# Patient Record
Sex: Male | Born: 1945 | Race: White | Hispanic: No
Health system: Southern US, Community
[De-identification: ages and names within clinical notes are randomized; demographics above are authoritative.]

## PROBLEM LIST (undated history)

## (undated) DIAGNOSIS — I10 Essential (primary) hypertension: Secondary | ICD-10-CM

## (undated) DIAGNOSIS — H3530 Unspecified macular degeneration: Secondary | ICD-10-CM

## (undated) HISTORY — DX: Unspecified macular degeneration: H35.30

## (undated) HISTORY — PX: KNEE ARTHROPLASTY: SHX992

## (undated) HISTORY — DX: Essential (primary) hypertension: I10

## (undated) HISTORY — PX: CATARACT EXTRACTION, BILATERAL: SHX1313

## (undated) HISTORY — PX: HERNIA REPAIR: SHX51

## (undated) HISTORY — PX: FOOT SURGERY: SHX648

---

## 1999-02-26 ENCOUNTER — Ambulatory Visit (HOSPITAL_BASED_OUTPATIENT_CLINIC_OR_DEPARTMENT_OTHER): Admission: RE | Admit: 1999-02-26 | Discharge: 1999-02-26 | Payer: Self-pay | Admitting: Orthopedic Surgery

## 2000-05-19 ENCOUNTER — Encounter: Admission: RE | Admit: 2000-05-19 | Discharge: 2000-05-19 | Payer: Self-pay | Admitting: General Surgery

## 2000-05-19 ENCOUNTER — Encounter: Payer: Self-pay | Admitting: General Surgery

## 2000-05-23 ENCOUNTER — Ambulatory Visit (HOSPITAL_BASED_OUTPATIENT_CLINIC_OR_DEPARTMENT_OTHER): Admission: RE | Admit: 2000-05-23 | Discharge: 2000-05-23 | Payer: Self-pay | Admitting: General Surgery

## 2002-06-17 ENCOUNTER — Ambulatory Visit (HOSPITAL_COMMUNITY): Admission: RE | Admit: 2002-06-17 | Discharge: 2002-06-17 | Payer: Self-pay | Admitting: *Deleted

## 2003-02-04 ENCOUNTER — Ambulatory Visit (HOSPITAL_COMMUNITY): Admission: RE | Admit: 2003-02-04 | Discharge: 2003-02-04 | Payer: Self-pay | Admitting: General Surgery

## 2003-02-04 ENCOUNTER — Encounter: Payer: Self-pay | Admitting: General Surgery

## 2005-03-18 ENCOUNTER — Ambulatory Visit (HOSPITAL_COMMUNITY): Admission: RE | Admit: 2005-03-18 | Discharge: 2005-03-18 | Payer: Self-pay | Admitting: Orthopedic Surgery

## 2005-03-18 ENCOUNTER — Ambulatory Visit (HOSPITAL_BASED_OUTPATIENT_CLINIC_OR_DEPARTMENT_OTHER): Admission: RE | Admit: 2005-03-18 | Discharge: 2005-03-18 | Payer: Self-pay | Admitting: Orthopedic Surgery

## 2009-08-29 ENCOUNTER — Encounter: Admission: RE | Admit: 2009-08-29 | Discharge: 2009-10-03 | Payer: Self-pay | Admitting: Orthopedic Surgery

## 2010-07-09 ENCOUNTER — Encounter
Admission: RE | Admit: 2010-07-09 | Discharge: 2010-07-27 | Payer: Self-pay | Source: Home / Self Care | Attending: Orthopedic Surgery | Admitting: Orthopedic Surgery

## 2010-07-29 ENCOUNTER — Encounter
Admission: RE | Admit: 2010-07-29 | Discharge: 2010-08-14 | Payer: Self-pay | Source: Home / Self Care | Attending: Orthopedic Surgery | Admitting: Orthopedic Surgery

## 2010-12-14 NOTE — Op Note (Signed)
NAMEOTT, Eric Skinner                ACCOUNT NO.:  [id number]   MEDICAL RECORD NO.:  [id number]          PATIENT TYPE:  AMB   LOCATION:  DSC                          FACILITY:  [facility]   PHYSICIAN:  Feliberto Gottron. Turner Daniels, M.D.   DATE OF BIRTH:  06-13-1946   DATE OF PROCEDURE:  03/18/2005  DATE OF DISCHARGE:                                 OPERATIVE REPORT   PREOPERATIVE DIAGNOSIS:  Left knee medial meniscal tear and chondromalacia.   POSTOPERATIVE DIAGNOSIS:  Left knee complex medial meniscal tear, grade III  chondromalacia with flap tears in the medial femoral condyle and trochlea  and lateral meniscal tear.   PROCEDURE:  Left knee arthroscopic partial medial meniscectomy, partial  lateral meniscectomy and debridement of grade III chondromalacia with flap  tears of the medial femoral condyle and the trochlea.   SURGEON:  Feliberto Gottron. Turner Daniels, M.D.   ASSISTANT:  None.   ANESTHETIC:  General LMA.   ESTIMATED BLOOD LOSS:  Minimal.   FLUID REPLACEMENT:  700 mL crystalloid.   DRAINS PLACED:  None.   TOURNIQUET TIME:  None.   INDICATIONS FOR PROCEDURE:  A 65 year old man who has had similar cartilage  problems in his right knee some years ago and underwent an arthroscopic  debridement, now has popping, catching and pain in his left knee.  Standing  x-rays show loss of cartilage medially, left greater than right. Exam is  consistent with the medial and lateral meniscal tears as well as  chondromalacia of the medial femoral condyle. He has failed conservative  treatment. Risks and benefits of surgical intervention discussed with the  patient and he is taken for arthroscopic evaluation and treatment of his  left knee.   DESCRIPTION OF PROCEDURE:  The patient identified by armband, taken to the  operating room at [facility]. Appropriate anesthetic  monitors were attached and general LMA anesthesia induced with the patient  in the supine position. Lateral post applied to the  table and the left lower  extremity prepped and draped in the usual sterile fashion from the ankle to  the mid thigh. We began the procedure by making standard inferomedial,  inferolateral peripatellar portals allowing introduction of the arthroscope  through the inferolateral portal and the outflow through the inferomedial  portal. Diagnostic arthroscopy revealed some small loose bodies in the joint  fluid.  The patella was in relatively good shape.  The trochlea had grade  III chondromalacia with peripheral flap tears that was debrided with a 3.5  gator sucker shaver as was chondromalacia with flap tears of the medial  femoral condyle distally and posteriorly.  The medial meniscus had a complex  medial meniscal tear and this was debrided back to a stable margin using the  3.5 gator sucker shaver as well as straight biters and then thoroughly  probed.  The ACL and PCL were intact. There was some light grade II  chondromalacia of the medial tibial plateau that was lightly debrided on the  lateral side.  The patient had a lateral to anterior degenerative tear of  the lateral meniscus that  was debrided back to a stable margin with the 3.5  gator sucker shaver and there was no significant chondromalacia. The gutters  were cleared medially and laterally and the knee irrigated out with normal  saline solution and the arthroscopic instruments removed. Prior to placement  of the dressing, the knee was infiltrated with 10 mL of 0.5% Marcaine and  epinephrine solution and then a dressing of Xeroform, 4x4 dressing sponges,  Webril and Ace wrap applied. The patient was then awakened and taken to the  recovery room without difficulty.      Feliberto Gottron. Turner Daniels, M.D.  Electronically Signed     FJR/MEDQ  D:  03/18/2005  T:  03/18/2005  Job:  [id number]

## 2015-11-15 DIAGNOSIS — H2513 Age-related nuclear cataract, bilateral: Secondary | ICD-10-CM | POA: Diagnosis not present

## 2015-11-15 DIAGNOSIS — H353131 Nonexudative age-related macular degeneration, bilateral, early dry stage: Secondary | ICD-10-CM | POA: Diagnosis not present

## 2016-03-21 DIAGNOSIS — D485 Neoplasm of uncertain behavior of skin: Secondary | ICD-10-CM | POA: Diagnosis not present

## 2016-03-21 DIAGNOSIS — L821 Other seborrheic keratosis: Secondary | ICD-10-CM | POA: Diagnosis not present

## 2016-03-21 DIAGNOSIS — L820 Inflamed seborrheic keratosis: Secondary | ICD-10-CM | POA: Diagnosis not present

## 2016-04-21 DIAGNOSIS — Z23 Encounter for immunization: Secondary | ICD-10-CM | POA: Diagnosis not present

## 2016-07-08 DIAGNOSIS — I10 Essential (primary) hypertension: Secondary | ICD-10-CM | POA: Diagnosis not present

## 2016-07-08 DIAGNOSIS — Z0000 Encounter for general adult medical examination without abnormal findings: Secondary | ICD-10-CM | POA: Diagnosis not present

## 2016-07-08 DIAGNOSIS — Z125 Encounter for screening for malignant neoplasm of prostate: Secondary | ICD-10-CM | POA: Diagnosis not present

## 2016-07-09 DIAGNOSIS — H2513 Age-related nuclear cataract, bilateral: Secondary | ICD-10-CM | POA: Diagnosis not present

## 2016-07-09 DIAGNOSIS — H353111 Nonexudative age-related macular degeneration, right eye, early dry stage: Secondary | ICD-10-CM | POA: Diagnosis not present

## 2016-07-09 DIAGNOSIS — H353220 Exudative age-related macular degeneration, left eye, stage unspecified: Secondary | ICD-10-CM | POA: Diagnosis not present

## 2016-07-11 DIAGNOSIS — I10 Essential (primary) hypertension: Secondary | ICD-10-CM | POA: Diagnosis not present

## 2016-07-11 DIAGNOSIS — Z125 Encounter for screening for malignant neoplasm of prostate: Secondary | ICD-10-CM | POA: Diagnosis not present

## 2016-07-11 DIAGNOSIS — Z0000 Encounter for general adult medical examination without abnormal findings: Secondary | ICD-10-CM | POA: Diagnosis not present

## 2016-07-11 DIAGNOSIS — E7800 Pure hypercholesterolemia, unspecified: Secondary | ICD-10-CM | POA: Diagnosis not present

## 2016-07-19 DIAGNOSIS — H35423 Microcystoid degeneration of retina, bilateral: Secondary | ICD-10-CM | POA: Diagnosis not present

## 2016-07-19 DIAGNOSIS — H43812 Vitreous degeneration, left eye: Secondary | ICD-10-CM | POA: Diagnosis not present

## 2016-07-19 DIAGNOSIS — H353112 Nonexudative age-related macular degeneration, right eye, intermediate dry stage: Secondary | ICD-10-CM | POA: Diagnosis not present

## 2016-07-19 DIAGNOSIS — H353221 Exudative age-related macular degeneration, left eye, with active choroidal neovascularization: Secondary | ICD-10-CM | POA: Diagnosis not present

## 2016-07-25 DIAGNOSIS — H353221 Exudative age-related macular degeneration, left eye, with active choroidal neovascularization: Secondary | ICD-10-CM | POA: Diagnosis not present

## 2016-08-23 DIAGNOSIS — H35423 Microcystoid degeneration of retina, bilateral: Secondary | ICD-10-CM | POA: Diagnosis not present

## 2016-08-23 DIAGNOSIS — H353112 Nonexudative age-related macular degeneration, right eye, intermediate dry stage: Secondary | ICD-10-CM | POA: Diagnosis not present

## 2016-08-23 DIAGNOSIS — H353221 Exudative age-related macular degeneration, left eye, with active choroidal neovascularization: Secondary | ICD-10-CM | POA: Diagnosis not present

## 2016-09-20 DIAGNOSIS — H353112 Nonexudative age-related macular degeneration, right eye, intermediate dry stage: Secondary | ICD-10-CM | POA: Diagnosis not present

## 2016-09-20 DIAGNOSIS — H35423 Microcystoid degeneration of retina, bilateral: Secondary | ICD-10-CM | POA: Diagnosis not present

## 2016-09-20 DIAGNOSIS — H353221 Exudative age-related macular degeneration, left eye, with active choroidal neovascularization: Secondary | ICD-10-CM | POA: Diagnosis not present

## 2016-10-18 DIAGNOSIS — H353112 Nonexudative age-related macular degeneration, right eye, intermediate dry stage: Secondary | ICD-10-CM | POA: Diagnosis not present

## 2016-10-18 DIAGNOSIS — H43812 Vitreous degeneration, left eye: Secondary | ICD-10-CM | POA: Diagnosis not present

## 2016-10-18 DIAGNOSIS — H353221 Exudative age-related macular degeneration, left eye, with active choroidal neovascularization: Secondary | ICD-10-CM | POA: Diagnosis not present

## 2016-10-18 DIAGNOSIS — H35423 Microcystoid degeneration of retina, bilateral: Secondary | ICD-10-CM | POA: Diagnosis not present

## 2016-11-15 DIAGNOSIS — H353221 Exudative age-related macular degeneration, left eye, with active choroidal neovascularization: Secondary | ICD-10-CM | POA: Diagnosis not present

## 2016-11-15 DIAGNOSIS — H353112 Nonexudative age-related macular degeneration, right eye, intermediate dry stage: Secondary | ICD-10-CM | POA: Diagnosis not present

## 2016-11-15 DIAGNOSIS — H35423 Microcystoid degeneration of retina, bilateral: Secondary | ICD-10-CM | POA: Diagnosis not present

## 2016-12-13 DIAGNOSIS — H353112 Nonexudative age-related macular degeneration, right eye, intermediate dry stage: Secondary | ICD-10-CM | POA: Diagnosis not present

## 2016-12-13 DIAGNOSIS — H353221 Exudative age-related macular degeneration, left eye, with active choroidal neovascularization: Secondary | ICD-10-CM | POA: Diagnosis not present

## 2016-12-13 DIAGNOSIS — H35423 Microcystoid degeneration of retina, bilateral: Secondary | ICD-10-CM | POA: Diagnosis not present

## 2017-01-17 DIAGNOSIS — H31091 Other chorioretinal scars, right eye: Secondary | ICD-10-CM | POA: Diagnosis not present

## 2017-01-17 DIAGNOSIS — H353112 Nonexudative age-related macular degeneration, right eye, intermediate dry stage: Secondary | ICD-10-CM | POA: Diagnosis not present

## 2017-01-17 DIAGNOSIS — H35423 Microcystoid degeneration of retina, bilateral: Secondary | ICD-10-CM | POA: Diagnosis not present

## 2017-01-17 DIAGNOSIS — H353221 Exudative age-related macular degeneration, left eye, with active choroidal neovascularization: Secondary | ICD-10-CM | POA: Diagnosis not present

## 2017-03-11 DIAGNOSIS — H31091 Other chorioretinal scars, right eye: Secondary | ICD-10-CM | POA: Diagnosis not present

## 2017-03-11 DIAGNOSIS — H35423 Microcystoid degeneration of retina, bilateral: Secondary | ICD-10-CM | POA: Diagnosis not present

## 2017-03-11 DIAGNOSIS — H353112 Nonexudative age-related macular degeneration, right eye, intermediate dry stage: Secondary | ICD-10-CM | POA: Diagnosis not present

## 2017-03-11 DIAGNOSIS — H353221 Exudative age-related macular degeneration, left eye, with active choroidal neovascularization: Secondary | ICD-10-CM | POA: Diagnosis not present

## 2017-03-27 DIAGNOSIS — R69 Illness, unspecified: Secondary | ICD-10-CM | POA: Diagnosis not present

## 2017-04-22 DIAGNOSIS — H35423 Microcystoid degeneration of retina, bilateral: Secondary | ICD-10-CM | POA: Diagnosis not present

## 2017-04-22 DIAGNOSIS — H353112 Nonexudative age-related macular degeneration, right eye, intermediate dry stage: Secondary | ICD-10-CM | POA: Diagnosis not present

## 2017-04-22 DIAGNOSIS — H31091 Other chorioretinal scars, right eye: Secondary | ICD-10-CM | POA: Diagnosis not present

## 2017-04-22 DIAGNOSIS — H353221 Exudative age-related macular degeneration, left eye, with active choroidal neovascularization: Secondary | ICD-10-CM | POA: Diagnosis not present

## 2017-06-03 DIAGNOSIS — H353221 Exudative age-related macular degeneration, left eye, with active choroidal neovascularization: Secondary | ICD-10-CM | POA: Diagnosis not present

## 2017-06-03 DIAGNOSIS — H35423 Microcystoid degeneration of retina, bilateral: Secondary | ICD-10-CM | POA: Diagnosis not present

## 2017-06-03 DIAGNOSIS — H31091 Other chorioretinal scars, right eye: Secondary | ICD-10-CM | POA: Diagnosis not present

## 2017-06-03 DIAGNOSIS — H353112 Nonexudative age-related macular degeneration, right eye, intermediate dry stage: Secondary | ICD-10-CM | POA: Diagnosis not present

## 2017-07-11 DIAGNOSIS — H35423 Microcystoid degeneration of retina, bilateral: Secondary | ICD-10-CM | POA: Diagnosis not present

## 2017-07-11 DIAGNOSIS — H31091 Other chorioretinal scars, right eye: Secondary | ICD-10-CM | POA: Diagnosis not present

## 2017-07-11 DIAGNOSIS — H353112 Nonexudative age-related macular degeneration, right eye, intermediate dry stage: Secondary | ICD-10-CM | POA: Diagnosis not present

## 2017-07-11 DIAGNOSIS — H353221 Exudative age-related macular degeneration, left eye, with active choroidal neovascularization: Secondary | ICD-10-CM | POA: Diagnosis not present

## 2017-07-24 DIAGNOSIS — M519 Unspecified thoracic, thoracolumbar and lumbosacral intervertebral disc disorder: Secondary | ICD-10-CM | POA: Diagnosis not present

## 2017-07-24 DIAGNOSIS — M4807 Spinal stenosis, lumbosacral region: Secondary | ICD-10-CM | POA: Diagnosis not present

## 2017-07-24 DIAGNOSIS — M532X7 Spinal instabilities, lumbosacral region: Secondary | ICD-10-CM | POA: Diagnosis not present

## 2017-07-30 DIAGNOSIS — Z125 Encounter for screening for malignant neoplasm of prostate: Secondary | ICD-10-CM | POA: Diagnosis not present

## 2017-07-30 DIAGNOSIS — I10 Essential (primary) hypertension: Secondary | ICD-10-CM | POA: Diagnosis not present

## 2017-07-30 DIAGNOSIS — E7800 Pure hypercholesterolemia, unspecified: Secondary | ICD-10-CM | POA: Diagnosis not present

## 2017-07-31 DIAGNOSIS — I10 Essential (primary) hypertension: Secondary | ICD-10-CM | POA: Diagnosis not present

## 2017-07-31 DIAGNOSIS — Z0000 Encounter for general adult medical examination without abnormal findings: Secondary | ICD-10-CM | POA: Diagnosis not present

## 2017-07-31 DIAGNOSIS — R972 Elevated prostate specific antigen [PSA]: Secondary | ICD-10-CM | POA: Diagnosis not present

## 2017-08-12 DIAGNOSIS — H35423 Microcystoid degeneration of retina, bilateral: Secondary | ICD-10-CM | POA: Diagnosis not present

## 2017-08-12 DIAGNOSIS — H353112 Nonexudative age-related macular degeneration, right eye, intermediate dry stage: Secondary | ICD-10-CM | POA: Diagnosis not present

## 2017-08-12 DIAGNOSIS — H31091 Other chorioretinal scars, right eye: Secondary | ICD-10-CM | POA: Diagnosis not present

## 2017-08-12 DIAGNOSIS — H353221 Exudative age-related macular degeneration, left eye, with active choroidal neovascularization: Secondary | ICD-10-CM | POA: Diagnosis not present

## 2017-09-17 DIAGNOSIS — H353221 Exudative age-related macular degeneration, left eye, with active choroidal neovascularization: Secondary | ICD-10-CM | POA: Diagnosis not present

## 2017-09-17 DIAGNOSIS — H353112 Nonexudative age-related macular degeneration, right eye, intermediate dry stage: Secondary | ICD-10-CM | POA: Diagnosis not present

## 2017-09-17 DIAGNOSIS — H35443 Age-related reticular degeneration of retina, bilateral: Secondary | ICD-10-CM | POA: Diagnosis not present

## 2017-09-17 DIAGNOSIS — H31091 Other chorioretinal scars, right eye: Secondary | ICD-10-CM | POA: Diagnosis not present

## 2017-10-09 DIAGNOSIS — R69 Illness, unspecified: Secondary | ICD-10-CM | POA: Diagnosis not present

## 2017-10-16 DIAGNOSIS — H353221 Exudative age-related macular degeneration, left eye, with active choroidal neovascularization: Secondary | ICD-10-CM | POA: Diagnosis not present

## 2017-10-16 DIAGNOSIS — H43811 Vitreous degeneration, right eye: Secondary | ICD-10-CM | POA: Diagnosis not present

## 2017-10-16 DIAGNOSIS — H353112 Nonexudative age-related macular degeneration, right eye, intermediate dry stage: Secondary | ICD-10-CM | POA: Diagnosis not present

## 2017-10-16 DIAGNOSIS — H35423 Microcystoid degeneration of retina, bilateral: Secondary | ICD-10-CM | POA: Diagnosis not present

## 2017-10-22 DIAGNOSIS — I10 Essential (primary) hypertension: Secondary | ICD-10-CM | POA: Diagnosis not present

## 2017-10-22 DIAGNOSIS — R972 Elevated prostate specific antigen [PSA]: Secondary | ICD-10-CM | POA: Diagnosis not present

## 2017-10-22 DIAGNOSIS — Z0000 Encounter for general adult medical examination without abnormal findings: Secondary | ICD-10-CM | POA: Diagnosis not present

## 2017-11-18 DIAGNOSIS — H31091 Other chorioretinal scars, right eye: Secondary | ICD-10-CM | POA: Diagnosis not present

## 2017-11-18 DIAGNOSIS — H35423 Microcystoid degeneration of retina, bilateral: Secondary | ICD-10-CM | POA: Diagnosis not present

## 2017-11-18 DIAGNOSIS — H353221 Exudative age-related macular degeneration, left eye, with active choroidal neovascularization: Secondary | ICD-10-CM | POA: Diagnosis not present

## 2017-11-18 DIAGNOSIS — H353112 Nonexudative age-related macular degeneration, right eye, intermediate dry stage: Secondary | ICD-10-CM | POA: Diagnosis not present

## 2017-12-01 DIAGNOSIS — K635 Polyp of colon: Secondary | ICD-10-CM | POA: Diagnosis not present

## 2017-12-01 DIAGNOSIS — K648 Other hemorrhoids: Secondary | ICD-10-CM | POA: Diagnosis not present

## 2017-12-01 DIAGNOSIS — Z1211 Encounter for screening for malignant neoplasm of colon: Secondary | ICD-10-CM | POA: Diagnosis not present

## 2017-12-03 DIAGNOSIS — K635 Polyp of colon: Secondary | ICD-10-CM | POA: Diagnosis not present

## 2017-12-16 DIAGNOSIS — H43811 Vitreous degeneration, right eye: Secondary | ICD-10-CM | POA: Diagnosis not present

## 2017-12-16 DIAGNOSIS — H353221 Exudative age-related macular degeneration, left eye, with active choroidal neovascularization: Secondary | ICD-10-CM | POA: Diagnosis not present

## 2017-12-16 DIAGNOSIS — H353112 Nonexudative age-related macular degeneration, right eye, intermediate dry stage: Secondary | ICD-10-CM | POA: Diagnosis not present

## 2017-12-16 DIAGNOSIS — H35423 Microcystoid degeneration of retina, bilateral: Secondary | ICD-10-CM | POA: Diagnosis not present

## 2018-01-13 DIAGNOSIS — H353221 Exudative age-related macular degeneration, left eye, with active choroidal neovascularization: Secondary | ICD-10-CM | POA: Diagnosis not present

## 2018-01-13 DIAGNOSIS — H353112 Nonexudative age-related macular degeneration, right eye, intermediate dry stage: Secondary | ICD-10-CM | POA: Diagnosis not present

## 2018-01-13 DIAGNOSIS — H35423 Microcystoid degeneration of retina, bilateral: Secondary | ICD-10-CM | POA: Diagnosis not present

## 2018-01-13 DIAGNOSIS — H31091 Other chorioretinal scars, right eye: Secondary | ICD-10-CM | POA: Diagnosis not present

## 2018-02-05 DIAGNOSIS — R69 Illness, unspecified: Secondary | ICD-10-CM | POA: Diagnosis not present

## 2018-02-10 DIAGNOSIS — H35423 Microcystoid degeneration of retina, bilateral: Secondary | ICD-10-CM | POA: Diagnosis not present

## 2018-02-10 DIAGNOSIS — H353221 Exudative age-related macular degeneration, left eye, with active choroidal neovascularization: Secondary | ICD-10-CM | POA: Diagnosis not present

## 2018-02-10 DIAGNOSIS — H353112 Nonexudative age-related macular degeneration, right eye, intermediate dry stage: Secondary | ICD-10-CM | POA: Diagnosis not present

## 2018-02-10 DIAGNOSIS — H31091 Other chorioretinal scars, right eye: Secondary | ICD-10-CM | POA: Diagnosis not present

## 2018-03-13 DIAGNOSIS — H35423 Microcystoid degeneration of retina, bilateral: Secondary | ICD-10-CM | POA: Diagnosis not present

## 2018-03-13 DIAGNOSIS — H353112 Nonexudative age-related macular degeneration, right eye, intermediate dry stage: Secondary | ICD-10-CM | POA: Diagnosis not present

## 2018-03-13 DIAGNOSIS — H31091 Other chorioretinal scars, right eye: Secondary | ICD-10-CM | POA: Diagnosis not present

## 2018-03-13 DIAGNOSIS — H353221 Exudative age-related macular degeneration, left eye, with active choroidal neovascularization: Secondary | ICD-10-CM | POA: Diagnosis not present

## 2018-04-03 DIAGNOSIS — D170 Benign lipomatous neoplasm of skin and subcutaneous tissue of head, face and neck: Secondary | ICD-10-CM | POA: Diagnosis not present

## 2018-04-03 DIAGNOSIS — L84 Corns and callosities: Secondary | ICD-10-CM | POA: Diagnosis not present

## 2018-04-03 DIAGNOSIS — L821 Other seborrheic keratosis: Secondary | ICD-10-CM | POA: Diagnosis not present

## 2018-04-17 DIAGNOSIS — H35423 Microcystoid degeneration of retina, bilateral: Secondary | ICD-10-CM | POA: Diagnosis not present

## 2018-04-17 DIAGNOSIS — H353112 Nonexudative age-related macular degeneration, right eye, intermediate dry stage: Secondary | ICD-10-CM | POA: Diagnosis not present

## 2018-04-17 DIAGNOSIS — H353221 Exudative age-related macular degeneration, left eye, with active choroidal neovascularization: Secondary | ICD-10-CM | POA: Diagnosis not present

## 2018-04-17 DIAGNOSIS — H31091 Other chorioretinal scars, right eye: Secondary | ICD-10-CM | POA: Diagnosis not present

## 2018-04-30 DIAGNOSIS — R69 Illness, unspecified: Secondary | ICD-10-CM | POA: Diagnosis not present

## 2018-05-19 DIAGNOSIS — H31091 Other chorioretinal scars, right eye: Secondary | ICD-10-CM | POA: Diagnosis not present

## 2018-05-19 DIAGNOSIS — H353221 Exudative age-related macular degeneration, left eye, with active choroidal neovascularization: Secondary | ICD-10-CM | POA: Diagnosis not present

## 2018-05-19 DIAGNOSIS — H353112 Nonexudative age-related macular degeneration, right eye, intermediate dry stage: Secondary | ICD-10-CM | POA: Diagnosis not present

## 2018-05-19 DIAGNOSIS — H35423 Microcystoid degeneration of retina, bilateral: Secondary | ICD-10-CM | POA: Diagnosis not present

## 2018-06-10 DIAGNOSIS — R69 Illness, unspecified: Secondary | ICD-10-CM | POA: Diagnosis not present

## 2018-06-23 DIAGNOSIS — H353112 Nonexudative age-related macular degeneration, right eye, intermediate dry stage: Secondary | ICD-10-CM | POA: Diagnosis not present

## 2018-06-23 DIAGNOSIS — H31091 Other chorioretinal scars, right eye: Secondary | ICD-10-CM | POA: Diagnosis not present

## 2018-06-23 DIAGNOSIS — H353221 Exudative age-related macular degeneration, left eye, with active choroidal neovascularization: Secondary | ICD-10-CM | POA: Diagnosis not present

## 2018-06-23 DIAGNOSIS — H35423 Microcystoid degeneration of retina, bilateral: Secondary | ICD-10-CM | POA: Diagnosis not present

## 2018-07-20 DIAGNOSIS — H43813 Vitreous degeneration, bilateral: Secondary | ICD-10-CM | POA: Diagnosis not present

## 2018-07-20 DIAGNOSIS — H353112 Nonexudative age-related macular degeneration, right eye, intermediate dry stage: Secondary | ICD-10-CM | POA: Diagnosis not present

## 2018-07-20 DIAGNOSIS — H353221 Exudative age-related macular degeneration, left eye, with active choroidal neovascularization: Secondary | ICD-10-CM | POA: Diagnosis not present

## 2018-08-13 DIAGNOSIS — H25813 Combined forms of age-related cataract, bilateral: Secondary | ICD-10-CM | POA: Diagnosis not present

## 2018-08-13 DIAGNOSIS — H524 Presbyopia: Secondary | ICD-10-CM | POA: Diagnosis not present

## 2018-08-13 DIAGNOSIS — H5213 Myopia, bilateral: Secondary | ICD-10-CM | POA: Diagnosis not present

## 2018-08-13 DIAGNOSIS — H35423 Microcystoid degeneration of retina, bilateral: Secondary | ICD-10-CM | POA: Diagnosis not present

## 2018-08-13 DIAGNOSIS — H353112 Nonexudative age-related macular degeneration, right eye, intermediate dry stage: Secondary | ICD-10-CM | POA: Diagnosis not present

## 2018-08-13 DIAGNOSIS — H353221 Exudative age-related macular degeneration, left eye, with active choroidal neovascularization: Secondary | ICD-10-CM | POA: Diagnosis not present

## 2018-08-24 DIAGNOSIS — I10 Essential (primary) hypertension: Secondary | ICD-10-CM | POA: Diagnosis not present

## 2018-08-24 DIAGNOSIS — Z125 Encounter for screening for malignant neoplasm of prostate: Secondary | ICD-10-CM | POA: Diagnosis not present

## 2018-08-24 DIAGNOSIS — E7800 Pure hypercholesterolemia, unspecified: Secondary | ICD-10-CM | POA: Diagnosis not present

## 2018-08-25 DIAGNOSIS — H353221 Exudative age-related macular degeneration, left eye, with active choroidal neovascularization: Secondary | ICD-10-CM | POA: Diagnosis not present

## 2018-08-25 DIAGNOSIS — H35423 Microcystoid degeneration of retina, bilateral: Secondary | ICD-10-CM | POA: Diagnosis not present

## 2018-08-25 DIAGNOSIS — H353112 Nonexudative age-related macular degeneration, right eye, intermediate dry stage: Secondary | ICD-10-CM | POA: Diagnosis not present

## 2018-08-25 DIAGNOSIS — H31091 Other chorioretinal scars, right eye: Secondary | ICD-10-CM | POA: Diagnosis not present

## 2018-08-28 DIAGNOSIS — Z1159 Encounter for screening for other viral diseases: Secondary | ICD-10-CM | POA: Diagnosis not present

## 2018-08-28 DIAGNOSIS — Z1389 Encounter for screening for other disorder: Secondary | ICD-10-CM | POA: Diagnosis not present

## 2018-08-28 DIAGNOSIS — E7800 Pure hypercholesterolemia, unspecified: Secondary | ICD-10-CM | POA: Diagnosis not present

## 2018-08-28 DIAGNOSIS — Z125 Encounter for screening for malignant neoplasm of prostate: Secondary | ICD-10-CM | POA: Diagnosis not present

## 2018-08-28 DIAGNOSIS — I10 Essential (primary) hypertension: Secondary | ICD-10-CM | POA: Diagnosis not present

## 2018-08-28 DIAGNOSIS — Z0000 Encounter for general adult medical examination without abnormal findings: Secondary | ICD-10-CM | POA: Diagnosis not present

## 2018-09-15 DIAGNOSIS — R69 Illness, unspecified: Secondary | ICD-10-CM | POA: Diagnosis not present

## 2018-09-29 DIAGNOSIS — H353221 Exudative age-related macular degeneration, left eye, with active choroidal neovascularization: Secondary | ICD-10-CM | POA: Diagnosis not present

## 2018-09-29 DIAGNOSIS — H353112 Nonexudative age-related macular degeneration, right eye, intermediate dry stage: Secondary | ICD-10-CM | POA: Diagnosis not present

## 2018-09-29 DIAGNOSIS — H31091 Other chorioretinal scars, right eye: Secondary | ICD-10-CM | POA: Diagnosis not present

## 2018-09-29 DIAGNOSIS — H35423 Microcystoid degeneration of retina, bilateral: Secondary | ICD-10-CM | POA: Diagnosis not present

## 2018-11-10 DIAGNOSIS — H353221 Exudative age-related macular degeneration, left eye, with active choroidal neovascularization: Secondary | ICD-10-CM | POA: Diagnosis not present

## 2018-12-25 DIAGNOSIS — H353221 Exudative age-related macular degeneration, left eye, with active choroidal neovascularization: Secondary | ICD-10-CM | POA: Diagnosis not present

## 2019-01-25 DIAGNOSIS — R69 Illness, unspecified: Secondary | ICD-10-CM | POA: Diagnosis not present

## 2019-02-05 DIAGNOSIS — H35423 Microcystoid degeneration of retina, bilateral: Secondary | ICD-10-CM | POA: Diagnosis not present

## 2019-02-05 DIAGNOSIS — H353112 Nonexudative age-related macular degeneration, right eye, intermediate dry stage: Secondary | ICD-10-CM | POA: Diagnosis not present

## 2019-02-05 DIAGNOSIS — H31091 Other chorioretinal scars, right eye: Secondary | ICD-10-CM | POA: Diagnosis not present

## 2019-02-05 DIAGNOSIS — H353221 Exudative age-related macular degeneration, left eye, with active choroidal neovascularization: Secondary | ICD-10-CM | POA: Diagnosis not present

## 2019-03-17 DIAGNOSIS — H353221 Exudative age-related macular degeneration, left eye, with active choroidal neovascularization: Secondary | ICD-10-CM | POA: Diagnosis not present

## 2019-03-17 DIAGNOSIS — H35423 Microcystoid degeneration of retina, bilateral: Secondary | ICD-10-CM | POA: Diagnosis not present

## 2019-03-17 DIAGNOSIS — H353112 Nonexudative age-related macular degeneration, right eye, intermediate dry stage: Secondary | ICD-10-CM | POA: Diagnosis not present

## 2019-03-17 DIAGNOSIS — H43813 Vitreous degeneration, bilateral: Secondary | ICD-10-CM | POA: Diagnosis not present

## 2019-04-02 DIAGNOSIS — R69 Illness, unspecified: Secondary | ICD-10-CM | POA: Diagnosis not present

## 2019-05-03 DIAGNOSIS — R69 Illness, unspecified: Secondary | ICD-10-CM | POA: Diagnosis not present

## 2019-05-12 DIAGNOSIS — H43813 Vitreous degeneration, bilateral: Secondary | ICD-10-CM | POA: Diagnosis not present

## 2019-05-12 DIAGNOSIS — H35423 Microcystoid degeneration of retina, bilateral: Secondary | ICD-10-CM | POA: Diagnosis not present

## 2019-05-12 DIAGNOSIS — H353112 Nonexudative age-related macular degeneration, right eye, intermediate dry stage: Secondary | ICD-10-CM | POA: Diagnosis not present

## 2019-05-12 DIAGNOSIS — H353221 Exudative age-related macular degeneration, left eye, with active choroidal neovascularization: Secondary | ICD-10-CM | POA: Diagnosis not present

## 2019-06-30 DIAGNOSIS — H31091 Other chorioretinal scars, right eye: Secondary | ICD-10-CM | POA: Diagnosis not present

## 2019-06-30 DIAGNOSIS — H353112 Nonexudative age-related macular degeneration, right eye, intermediate dry stage: Secondary | ICD-10-CM | POA: Diagnosis not present

## 2019-06-30 DIAGNOSIS — H35423 Microcystoid degeneration of retina, bilateral: Secondary | ICD-10-CM | POA: Diagnosis not present

## 2019-06-30 DIAGNOSIS — H353221 Exudative age-related macular degeneration, left eye, with active choroidal neovascularization: Secondary | ICD-10-CM | POA: Diagnosis not present

## 2019-08-20 DIAGNOSIS — H353221 Exudative age-related macular degeneration, left eye, with active choroidal neovascularization: Secondary | ICD-10-CM | POA: Diagnosis not present

## 2019-08-20 DIAGNOSIS — H35423 Microcystoid degeneration of retina, bilateral: Secondary | ICD-10-CM | POA: Diagnosis not present

## 2019-08-20 DIAGNOSIS — H43813 Vitreous degeneration, bilateral: Secondary | ICD-10-CM | POA: Diagnosis not present

## 2019-08-20 DIAGNOSIS — H353112 Nonexudative age-related macular degeneration, right eye, intermediate dry stage: Secondary | ICD-10-CM | POA: Diagnosis not present

## 2019-09-08 DIAGNOSIS — R69 Illness, unspecified: Secondary | ICD-10-CM | POA: Diagnosis not present

## 2019-09-13 DIAGNOSIS — M6530 Trigger finger, unspecified finger: Secondary | ICD-10-CM | POA: Diagnosis not present

## 2019-09-13 DIAGNOSIS — I10 Essential (primary) hypertension: Secondary | ICD-10-CM | POA: Diagnosis not present

## 2019-09-13 DIAGNOSIS — Z0000 Encounter for general adult medical examination without abnormal findings: Secondary | ICD-10-CM | POA: Diagnosis not present

## 2019-09-17 DIAGNOSIS — I10 Essential (primary) hypertension: Secondary | ICD-10-CM | POA: Diagnosis not present

## 2019-09-17 DIAGNOSIS — Z125 Encounter for screening for malignant neoplasm of prostate: Secondary | ICD-10-CM | POA: Diagnosis not present

## 2019-09-17 DIAGNOSIS — Z13220 Encounter for screening for lipoid disorders: Secondary | ICD-10-CM | POA: Diagnosis not present

## 2019-09-17 DIAGNOSIS — E7800 Pure hypercholesterolemia, unspecified: Secondary | ICD-10-CM | POA: Diagnosis not present

## 2019-09-17 DIAGNOSIS — Z1159 Encounter for screening for other viral diseases: Secondary | ICD-10-CM | POA: Diagnosis not present

## 2019-09-17 DIAGNOSIS — Z0000 Encounter for general adult medical examination without abnormal findings: Secondary | ICD-10-CM | POA: Diagnosis not present

## 2019-09-19 ENCOUNTER — Ambulatory Visit: Payer: Medicare HMO | Attending: Internal Medicine

## 2019-09-19 DIAGNOSIS — Z23 Encounter for immunization: Secondary | ICD-10-CM | POA: Insufficient documentation

## 2019-09-19 NOTE — Progress Notes (Signed)
   Covid-19 Vaccination Clinic  Name:  Eric Skinner    MRN: [id number] DOB: May 07, 1946  09/19/2019  Mr. Eric Skinner was observed post Covid-19 immunization for 15 minutes without incidence. He was provided with Vaccine Information Sheet and instruction to access the V-Safe system.   Mr. Eric Skinner was instructed to call 911 with any severe reactions post vaccine: Marland Kitchen Difficulty breathing  . Swelling of your face and throat  . A fast heartbeat  . A bad rash all over your body  . Dizziness and weakness    Immunizations Administered    Name Date Dose VIS Date Route   Pfizer COVID-19 Vaccine 09/19/2019  3:07 PM 0.3 mL 07/09/2019 Intramuscular   Manufacturer: [place]   Lot: [id number]   [place]: [id number]

## 2019-10-06 ENCOUNTER — Ambulatory Visit: Payer: Medicare HMO

## 2019-10-06 DIAGNOSIS — H353221 Exudative age-related macular degeneration, left eye, with active choroidal neovascularization: Secondary | ICD-10-CM | POA: Diagnosis not present

## 2019-10-06 DIAGNOSIS — H43813 Vitreous degeneration, bilateral: Secondary | ICD-10-CM | POA: Diagnosis not present

## 2019-10-06 DIAGNOSIS — H353112 Nonexudative age-related macular degeneration, right eye, intermediate dry stage: Secondary | ICD-10-CM | POA: Diagnosis not present

## 2019-10-06 DIAGNOSIS — H35423 Microcystoid degeneration of retina, bilateral: Secondary | ICD-10-CM | POA: Diagnosis not present

## 2019-10-20 ENCOUNTER — Ambulatory Visit: Payer: Medicare HMO | Attending: Internal Medicine

## 2019-10-20 DIAGNOSIS — Z23 Encounter for immunization: Secondary | ICD-10-CM

## 2019-10-20 NOTE — Progress Notes (Signed)
   Covid-19 Vaccination Clinic  Name:  Eric Skinner    MRN: [id number] DOB: [birth year]  10/20/2019  [name] was observed post Covid-19 immunization for 15 minutes without incident. He was provided with Vaccine Information Sheet and instruction to access the V-Safe system.   [name] was instructed to call 911 with any severe reactions post vaccine: Marland Kitchen Difficulty breathing  . Swelling of face and throat  . A fast heartbeat  . A bad rash all over body  . Dizziness and weakness   Immunizations Administered    Name Date Dose VIS Date Route   Pfizer COVID-19 Vaccine 10/20/2019  3:09 PM 0.3 mL 07/09/2019 Intramuscular   Manufacturer: [place]   Lot: [id number]   [place]: [id number]

## 2019-11-17 DIAGNOSIS — M13849 Other specified arthritis, unspecified hand: Secondary | ICD-10-CM | POA: Diagnosis not present

## 2019-11-17 DIAGNOSIS — M65341 Trigger finger, right ring finger: Secondary | ICD-10-CM | POA: Diagnosis not present

## 2019-11-17 DIAGNOSIS — M13841 Other specified arthritis, right hand: Secondary | ICD-10-CM | POA: Diagnosis not present

## 2019-11-17 DIAGNOSIS — M79641 Pain in right hand: Secondary | ICD-10-CM | POA: Diagnosis not present

## 2019-11-26 DIAGNOSIS — H353221 Exudative age-related macular degeneration, left eye, with active choroidal neovascularization: Secondary | ICD-10-CM | POA: Diagnosis not present

## 2019-11-26 DIAGNOSIS — H353112 Nonexudative age-related macular degeneration, right eye, intermediate dry stage: Secondary | ICD-10-CM | POA: Diagnosis not present

## 2019-11-26 DIAGNOSIS — H43813 Vitreous degeneration, bilateral: Secondary | ICD-10-CM | POA: Diagnosis not present

## 2019-12-16 DIAGNOSIS — M79641 Pain in right hand: Secondary | ICD-10-CM | POA: Diagnosis not present

## 2019-12-16 DIAGNOSIS — M65341 Trigger finger, right ring finger: Secondary | ICD-10-CM | POA: Diagnosis not present

## 2020-01-05 DIAGNOSIS — H353221 Exudative age-related macular degeneration, left eye, with active choroidal neovascularization: Secondary | ICD-10-CM | POA: Diagnosis not present

## 2020-01-05 DIAGNOSIS — H35423 Microcystoid degeneration of retina, bilateral: Secondary | ICD-10-CM | POA: Diagnosis not present

## 2020-01-05 DIAGNOSIS — H353112 Nonexudative age-related macular degeneration, right eye, intermediate dry stage: Secondary | ICD-10-CM | POA: Diagnosis not present

## 2020-01-05 DIAGNOSIS — H43813 Vitreous degeneration, bilateral: Secondary | ICD-10-CM | POA: Diagnosis not present

## 2020-01-12 DIAGNOSIS — R69 Illness, unspecified: Secondary | ICD-10-CM | POA: Diagnosis not present

## 2020-08-08 DIAGNOSIS — H31091 Other chorioretinal scars, right eye: Secondary | ICD-10-CM | POA: Diagnosis not present

## 2020-08-08 DIAGNOSIS — H353221 Exudative age-related macular degeneration, left eye, with active choroidal neovascularization: Secondary | ICD-10-CM | POA: Diagnosis not present

## 2020-08-08 DIAGNOSIS — H353112 Nonexudative age-related macular degeneration, right eye, intermediate dry stage: Secondary | ICD-10-CM | POA: Diagnosis not present

## 2020-08-08 DIAGNOSIS — H35423 Microcystoid degeneration of retina, bilateral: Secondary | ICD-10-CM | POA: Diagnosis not present

## 2020-09-13 DIAGNOSIS — Z125 Encounter for screening for malignant neoplasm of prostate: Secondary | ICD-10-CM | POA: Diagnosis not present

## 2020-09-13 DIAGNOSIS — E7800 Pure hypercholesterolemia, unspecified: Secondary | ICD-10-CM | POA: Diagnosis not present

## 2020-09-13 DIAGNOSIS — I10 Essential (primary) hypertension: Secondary | ICD-10-CM | POA: Diagnosis not present

## 2020-09-18 DIAGNOSIS — Z0000 Encounter for general adult medical examination without abnormal findings: Secondary | ICD-10-CM | POA: Diagnosis not present

## 2020-09-18 DIAGNOSIS — I10 Essential (primary) hypertension: Secondary | ICD-10-CM | POA: Diagnosis not present

## 2020-09-18 DIAGNOSIS — Z125 Encounter for screening for malignant neoplasm of prostate: Secondary | ICD-10-CM | POA: Diagnosis not present

## 2020-09-18 DIAGNOSIS — Z23 Encounter for immunization: Secondary | ICD-10-CM | POA: Diagnosis not present

## 2020-09-18 DIAGNOSIS — E7800 Pure hypercholesterolemia, unspecified: Secondary | ICD-10-CM | POA: Diagnosis not present

## 2020-10-03 DIAGNOSIS — H35423 Microcystoid degeneration of retina, bilateral: Secondary | ICD-10-CM | POA: Diagnosis not present

## 2020-10-03 DIAGNOSIS — H43813 Vitreous degeneration, bilateral: Secondary | ICD-10-CM | POA: Diagnosis not present

## 2020-10-03 DIAGNOSIS — H353221 Exudative age-related macular degeneration, left eye, with active choroidal neovascularization: Secondary | ICD-10-CM | POA: Diagnosis not present

## 2020-10-03 DIAGNOSIS — H353112 Nonexudative age-related macular degeneration, right eye, intermediate dry stage: Secondary | ICD-10-CM | POA: Diagnosis not present

## 2020-11-01 DIAGNOSIS — H353221 Exudative age-related macular degeneration, left eye, with active choroidal neovascularization: Secondary | ICD-10-CM | POA: Diagnosis not present

## 2020-11-01 DIAGNOSIS — H35423 Microcystoid degeneration of retina, bilateral: Secondary | ICD-10-CM | POA: Diagnosis not present

## 2020-11-01 DIAGNOSIS — H353112 Nonexudative age-related macular degeneration, right eye, intermediate dry stage: Secondary | ICD-10-CM | POA: Diagnosis not present

## 2020-11-01 DIAGNOSIS — H35033 Hypertensive retinopathy, bilateral: Secondary | ICD-10-CM | POA: Diagnosis not present

## 2020-11-29 DIAGNOSIS — H353112 Nonexudative age-related macular degeneration, right eye, intermediate dry stage: Secondary | ICD-10-CM | POA: Diagnosis not present

## 2020-11-29 DIAGNOSIS — H43813 Vitreous degeneration, bilateral: Secondary | ICD-10-CM | POA: Diagnosis not present

## 2020-11-29 DIAGNOSIS — H43821 Vitreomacular adhesion, right eye: Secondary | ICD-10-CM | POA: Diagnosis not present

## 2020-11-29 DIAGNOSIS — H353221 Exudative age-related macular degeneration, left eye, with active choroidal neovascularization: Secondary | ICD-10-CM | POA: Diagnosis not present

## 2021-01-08 DIAGNOSIS — M65351 Trigger finger, right little finger: Secondary | ICD-10-CM | POA: Diagnosis not present

## 2021-01-08 DIAGNOSIS — M79644 Pain in right finger(s): Secondary | ICD-10-CM | POA: Diagnosis not present

## 2021-01-08 DIAGNOSIS — G5602 Carpal tunnel syndrome, left upper limb: Secondary | ICD-10-CM | POA: Diagnosis not present

## 2021-01-08 DIAGNOSIS — M79642 Pain in left hand: Secondary | ICD-10-CM | POA: Diagnosis not present

## 2021-01-17 DIAGNOSIS — H43821 Vitreomacular adhesion, right eye: Secondary | ICD-10-CM | POA: Diagnosis not present

## 2021-01-17 DIAGNOSIS — H43813 Vitreous degeneration, bilateral: Secondary | ICD-10-CM | POA: Diagnosis not present

## 2021-01-17 DIAGNOSIS — H353221 Exudative age-related macular degeneration, left eye, with active choroidal neovascularization: Secondary | ICD-10-CM | POA: Diagnosis not present

## 2021-01-17 DIAGNOSIS — H353112 Nonexudative age-related macular degeneration, right eye, intermediate dry stage: Secondary | ICD-10-CM | POA: Diagnosis not present

## 2021-02-09 DIAGNOSIS — Z0100 Encounter for examination of eyes and vision without abnormal findings: Secondary | ICD-10-CM | POA: Diagnosis not present

## 2021-03-27 DIAGNOSIS — H43813 Vitreous degeneration, bilateral: Secondary | ICD-10-CM | POA: Diagnosis not present

## 2021-03-27 DIAGNOSIS — H353221 Exudative age-related macular degeneration, left eye, with active choroidal neovascularization: Secondary | ICD-10-CM | POA: Diagnosis not present

## 2021-03-27 DIAGNOSIS — H43821 Vitreomacular adhesion, right eye: Secondary | ICD-10-CM | POA: Diagnosis not present

## 2021-03-27 DIAGNOSIS — H353112 Nonexudative age-related macular degeneration, right eye, intermediate dry stage: Secondary | ICD-10-CM | POA: Diagnosis not present

## 2021-06-13 DIAGNOSIS — H353221 Exudative age-related macular degeneration, left eye, with active choroidal neovascularization: Secondary | ICD-10-CM | POA: Diagnosis not present

## 2021-06-13 DIAGNOSIS — H43813 Vitreous degeneration, bilateral: Secondary | ICD-10-CM | POA: Diagnosis not present

## 2021-06-13 DIAGNOSIS — H43821 Vitreomacular adhesion, right eye: Secondary | ICD-10-CM | POA: Diagnosis not present

## 2021-06-13 DIAGNOSIS — H353112 Nonexudative age-related macular degeneration, right eye, intermediate dry stage: Secondary | ICD-10-CM | POA: Diagnosis not present

## 2021-07-11 DIAGNOSIS — M65351 Trigger finger, right little finger: Secondary | ICD-10-CM | POA: Diagnosis not present

## 2021-07-11 DIAGNOSIS — G5602 Carpal tunnel syndrome, left upper limb: Secondary | ICD-10-CM | POA: Diagnosis not present

## 2021-07-11 DIAGNOSIS — M65321 Trigger finger, right index finger: Secondary | ICD-10-CM | POA: Diagnosis not present

## 2021-07-11 DIAGNOSIS — M79642 Pain in left hand: Secondary | ICD-10-CM | POA: Diagnosis not present

## 2021-07-11 DIAGNOSIS — M79644 Pain in right finger(s): Secondary | ICD-10-CM | POA: Diagnosis not present

## 2021-09-12 DIAGNOSIS — H43821 Vitreomacular adhesion, right eye: Secondary | ICD-10-CM | POA: Diagnosis not present

## 2021-09-12 DIAGNOSIS — H43813 Vitreous degeneration, bilateral: Secondary | ICD-10-CM | POA: Diagnosis not present

## 2021-09-12 DIAGNOSIS — H353221 Exudative age-related macular degeneration, left eye, with active choroidal neovascularization: Secondary | ICD-10-CM | POA: Diagnosis not present

## 2021-09-12 DIAGNOSIS — H353112 Nonexudative age-related macular degeneration, right eye, intermediate dry stage: Secondary | ICD-10-CM | POA: Diagnosis not present

## 2021-09-19 DIAGNOSIS — I10 Essential (primary) hypertension: Secondary | ICD-10-CM | POA: Diagnosis not present

## 2021-09-19 DIAGNOSIS — Z125 Encounter for screening for malignant neoplasm of prostate: Secondary | ICD-10-CM | POA: Diagnosis not present

## 2021-09-19 DIAGNOSIS — E7800 Pure hypercholesterolemia, unspecified: Secondary | ICD-10-CM | POA: Diagnosis not present

## 2021-09-24 ENCOUNTER — Other Ambulatory Visit: Payer: Self-pay | Admitting: Family Medicine

## 2021-09-24 DIAGNOSIS — Z0000 Encounter for general adult medical examination without abnormal findings: Secondary | ICD-10-CM | POA: Diagnosis not present

## 2021-09-24 DIAGNOSIS — Z125 Encounter for screening for malignant neoplasm of prostate: Secondary | ICD-10-CM | POA: Diagnosis not present

## 2021-09-24 DIAGNOSIS — I10 Essential (primary) hypertension: Secondary | ICD-10-CM | POA: Diagnosis not present

## 2021-09-24 DIAGNOSIS — E7800 Pure hypercholesterolemia, unspecified: Secondary | ICD-10-CM | POA: Diagnosis not present

## 2021-09-24 DIAGNOSIS — L570 Actinic keratosis: Secondary | ICD-10-CM | POA: Diagnosis not present

## 2021-10-31 ENCOUNTER — Ambulatory Visit
Admission: RE | Admit: 2021-10-31 | Discharge: 2021-10-31 | Disposition: A | Discharge status: Home / Self Care | Payer: No Typology Code available for payment source | Source: Ambulatory Visit | Attending: Family Medicine | Admitting: Family Medicine

## 2021-10-31 DIAGNOSIS — E7800 Pure hypercholesterolemia, unspecified: Secondary | ICD-10-CM

## 2021-11-08 DIAGNOSIS — L821 Other seborrheic keratosis: Secondary | ICD-10-CM | POA: Diagnosis not present

## 2021-11-08 DIAGNOSIS — L720 Epidermal cyst: Secondary | ICD-10-CM | POA: Diagnosis not present

## 2021-11-08 DIAGNOSIS — L810 Postinflammatory hyperpigmentation: Secondary | ICD-10-CM | POA: Diagnosis not present

## 2021-12-12 DIAGNOSIS — H353221 Exudative age-related macular degeneration, left eye, with active choroidal neovascularization: Secondary | ICD-10-CM | POA: Diagnosis not present

## 2021-12-12 DIAGNOSIS — H35423 Microcystoid degeneration of retina, bilateral: Secondary | ICD-10-CM | POA: Diagnosis not present

## 2021-12-12 DIAGNOSIS — H353112 Nonexudative age-related macular degeneration, right eye, intermediate dry stage: Secondary | ICD-10-CM | POA: Diagnosis not present

## 2021-12-12 DIAGNOSIS — H43821 Vitreomacular adhesion, right eye: Secondary | ICD-10-CM | POA: Diagnosis not present

## 2022-01-03 DIAGNOSIS — M65341 Trigger finger, right ring finger: Secondary | ICD-10-CM | POA: Diagnosis not present

## 2022-01-03 DIAGNOSIS — M65321 Trigger finger, right index finger: Secondary | ICD-10-CM | POA: Diagnosis not present

## 2022-01-17 DIAGNOSIS — M25642 Stiffness of left hand, not elsewhere classified: Secondary | ICD-10-CM | POA: Diagnosis not present

## 2022-04-17 DIAGNOSIS — H353112 Nonexudative age-related macular degeneration, right eye, intermediate dry stage: Secondary | ICD-10-CM | POA: Diagnosis not present

## 2022-04-17 DIAGNOSIS — H43813 Vitreous degeneration, bilateral: Secondary | ICD-10-CM | POA: Diagnosis not present

## 2022-04-17 DIAGNOSIS — H353221 Exudative age-related macular degeneration, left eye, with active choroidal neovascularization: Secondary | ICD-10-CM | POA: Diagnosis not present

## 2022-04-17 DIAGNOSIS — H43821 Vitreomacular adhesion, right eye: Secondary | ICD-10-CM | POA: Diagnosis not present

## 2022-06-13 DIAGNOSIS — H43821 Vitreomacular adhesion, right eye: Secondary | ICD-10-CM | POA: Diagnosis not present

## 2022-06-13 DIAGNOSIS — H353112 Nonexudative age-related macular degeneration, right eye, intermediate dry stage: Secondary | ICD-10-CM | POA: Diagnosis not present

## 2022-06-13 DIAGNOSIS — H43813 Vitreous degeneration, bilateral: Secondary | ICD-10-CM | POA: Diagnosis not present

## 2022-06-13 DIAGNOSIS — H353221 Exudative age-related macular degeneration, left eye, with active choroidal neovascularization: Secondary | ICD-10-CM | POA: Diagnosis not present

## 2022-08-13 DIAGNOSIS — H43813 Vitreous degeneration, bilateral: Secondary | ICD-10-CM | POA: Diagnosis not present

## 2022-08-13 DIAGNOSIS — H43821 Vitreomacular adhesion, right eye: Secondary | ICD-10-CM | POA: Diagnosis not present

## 2022-08-13 DIAGNOSIS — H353112 Nonexudative age-related macular degeneration, right eye, intermediate dry stage: Secondary | ICD-10-CM | POA: Diagnosis not present

## 2022-08-13 DIAGNOSIS — H2513 Age-related nuclear cataract, bilateral: Secondary | ICD-10-CM | POA: Diagnosis not present

## 2022-08-13 DIAGNOSIS — H43391 Other vitreous opacities, right eye: Secondary | ICD-10-CM | POA: Diagnosis not present

## 2022-08-13 DIAGNOSIS — H353221 Exudative age-related macular degeneration, left eye, with active choroidal neovascularization: Secondary | ICD-10-CM | POA: Diagnosis not present

## 2022-09-25 DIAGNOSIS — E7800 Pure hypercholesterolemia, unspecified: Secondary | ICD-10-CM | POA: Diagnosis not present

## 2022-09-25 DIAGNOSIS — Z125 Encounter for screening for malignant neoplasm of prostate: Secondary | ICD-10-CM | POA: Diagnosis not present

## 2022-09-25 DIAGNOSIS — I10 Essential (primary) hypertension: Secondary | ICD-10-CM | POA: Diagnosis not present

## 2022-10-02 DIAGNOSIS — Z6831 Body mass index (BMI) 31.0-31.9, adult: Secondary | ICD-10-CM | POA: Diagnosis not present

## 2022-10-02 DIAGNOSIS — Z125 Encounter for screening for malignant neoplasm of prostate: Secondary | ICD-10-CM | POA: Diagnosis not present

## 2022-10-02 DIAGNOSIS — I10 Essential (primary) hypertension: Secondary | ICD-10-CM | POA: Diagnosis not present

## 2022-10-02 DIAGNOSIS — Z0000 Encounter for general adult medical examination without abnormal findings: Secondary | ICD-10-CM | POA: Diagnosis not present

## 2022-10-02 DIAGNOSIS — E7800 Pure hypercholesterolemia, unspecified: Secondary | ICD-10-CM | POA: Diagnosis not present

## 2022-10-09 DIAGNOSIS — H43821 Vitreomacular adhesion, right eye: Secondary | ICD-10-CM | POA: Diagnosis not present

## 2022-10-09 DIAGNOSIS — H353221 Exudative age-related macular degeneration, left eye, with active choroidal neovascularization: Secondary | ICD-10-CM | POA: Diagnosis not present

## 2022-10-09 DIAGNOSIS — H43813 Vitreous degeneration, bilateral: Secondary | ICD-10-CM | POA: Diagnosis not present

## 2022-10-09 DIAGNOSIS — H353112 Nonexudative age-related macular degeneration, right eye, intermediate dry stage: Secondary | ICD-10-CM | POA: Diagnosis not present

## 2022-10-09 DIAGNOSIS — H43391 Other vitreous opacities, right eye: Secondary | ICD-10-CM | POA: Diagnosis not present

## 2022-10-09 DIAGNOSIS — H2513 Age-related nuclear cataract, bilateral: Secondary | ICD-10-CM | POA: Diagnosis not present

## 2022-11-14 DIAGNOSIS — M65841 Other synovitis and tenosynovitis, right hand: Secondary | ICD-10-CM | POA: Diagnosis not present

## 2022-11-14 DIAGNOSIS — M65331 Trigger finger, right middle finger: Secondary | ICD-10-CM | POA: Diagnosis not present

## 2022-11-20 DIAGNOSIS — Z4789 Encounter for other orthopedic aftercare: Secondary | ICD-10-CM | POA: Diagnosis not present

## 2022-11-20 DIAGNOSIS — M65331 Trigger finger, right middle finger: Secondary | ICD-10-CM | POA: Diagnosis not present

## 2022-12-04 DIAGNOSIS — M25641 Stiffness of right hand, not elsewhere classified: Secondary | ICD-10-CM | POA: Diagnosis not present

## 2022-12-04 DIAGNOSIS — M65331 Trigger finger, right middle finger: Secondary | ICD-10-CM | POA: Diagnosis not present

## 2022-12-18 DIAGNOSIS — H353112 Nonexudative age-related macular degeneration, right eye, intermediate dry stage: Secondary | ICD-10-CM | POA: Diagnosis not present

## 2022-12-18 DIAGNOSIS — H43813 Vitreous degeneration, bilateral: Secondary | ICD-10-CM | POA: Diagnosis not present

## 2022-12-18 DIAGNOSIS — H2513 Age-related nuclear cataract, bilateral: Secondary | ICD-10-CM | POA: Diagnosis not present

## 2022-12-18 DIAGNOSIS — H353221 Exudative age-related macular degeneration, left eye, with active choroidal neovascularization: Secondary | ICD-10-CM | POA: Diagnosis not present

## 2022-12-18 DIAGNOSIS — H43391 Other vitreous opacities, right eye: Secondary | ICD-10-CM | POA: Diagnosis not present

## 2022-12-18 DIAGNOSIS — H43821 Vitreomacular adhesion, right eye: Secondary | ICD-10-CM | POA: Diagnosis not present

## 2023-02-10 DIAGNOSIS — H35033 Hypertensive retinopathy, bilateral: Secondary | ICD-10-CM | POA: Diagnosis not present

## 2023-02-10 DIAGNOSIS — H5213 Myopia, bilateral: Secondary | ICD-10-CM | POA: Diagnosis not present

## 2023-02-10 DIAGNOSIS — H524 Presbyopia: Secondary | ICD-10-CM | POA: Diagnosis not present

## 2023-02-10 DIAGNOSIS — H353112 Nonexudative age-related macular degeneration, right eye, intermediate dry stage: Secondary | ICD-10-CM | POA: Diagnosis not present

## 2023-02-10 DIAGNOSIS — H52223 Regular astigmatism, bilateral: Secondary | ICD-10-CM | POA: Diagnosis not present

## 2023-02-10 DIAGNOSIS — H25813 Combined forms of age-related cataract, bilateral: Secondary | ICD-10-CM | POA: Diagnosis not present

## 2023-02-10 DIAGNOSIS — H353221 Exudative age-related macular degeneration, left eye, with active choroidal neovascularization: Secondary | ICD-10-CM | POA: Diagnosis not present

## 2023-02-25 IMAGING — CT CT CARDIAC CORONARY ARTERY CALCIUM SCORE
3 series · 14 of 20 positions shown, 16 images · non-contrast
Comparison: None.

CLINICAL DATA: 76-year-old Caucasian male with high cholesterol.

EXAM:
CT CARDIAC CORONARY ARTERY CALCIUM SCORE
TECHNIQUE: Non-contrast imaging through the heart was performed using
prospective ECG gating. Image post processing was performed on an
independent workstation, allowing for quantitative analysis of the
heart and coronary arteries. Note that this exam targets the heart
and the chest was not imaged in its entirety.

[Series 2: calcium scoring 2.00 qr36 bestdiast 71% hrt calciu · axial · 0.41mm/px · z∈[+1585,+1673]mm · 4 of 74 slices shown]
[im 15/74  vessel]
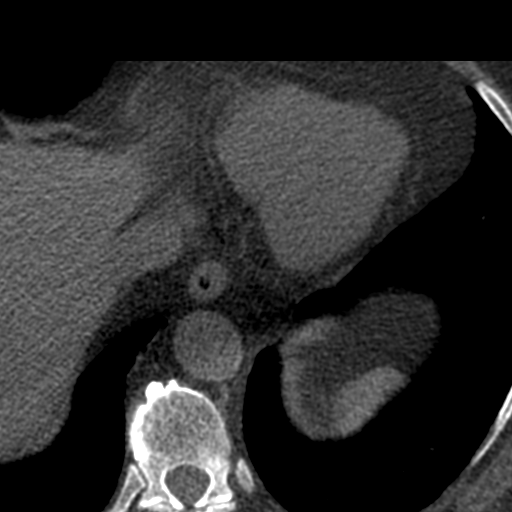
[im 30/74  vessel]
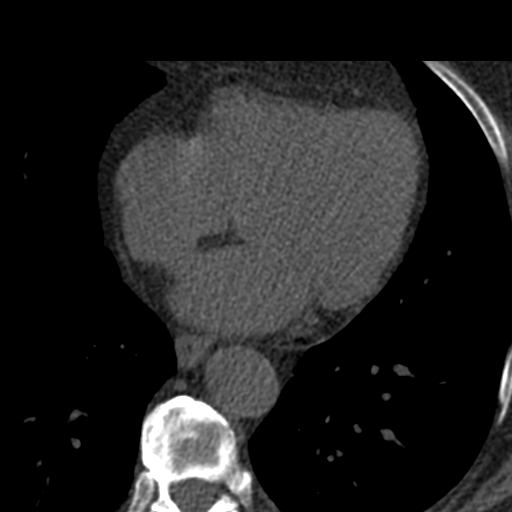
[im 44/74  vessel]
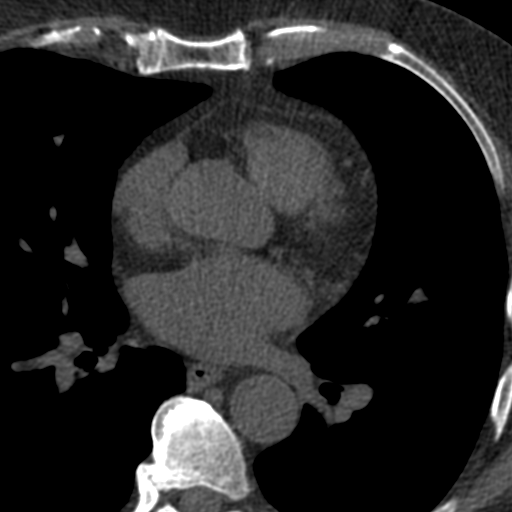
[im 59/74  vessel]
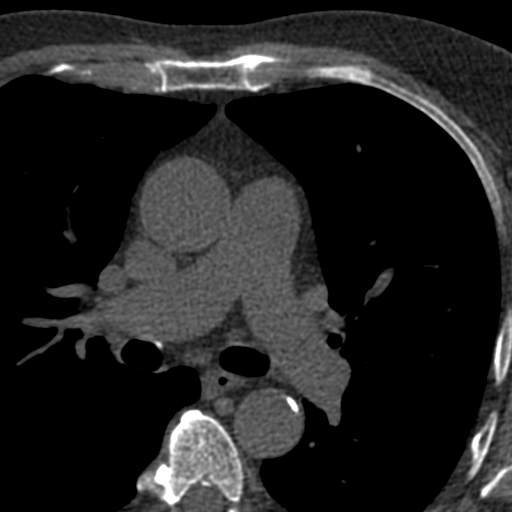

[Series 3: calcium scoring 2.00 br40 bestdiast 71% axial · axial · 0.61mm/px · z∈[+1571,+1675]mm · 5 of 80 slices shown, 7 images]
[im 14/80  vessel]
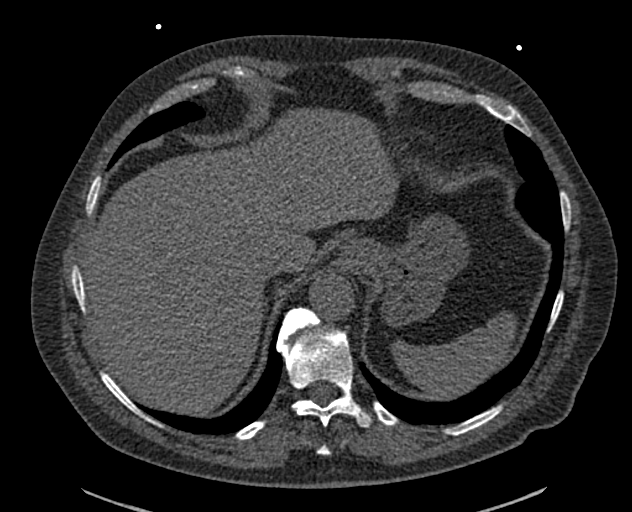
[im 14/80  lung]
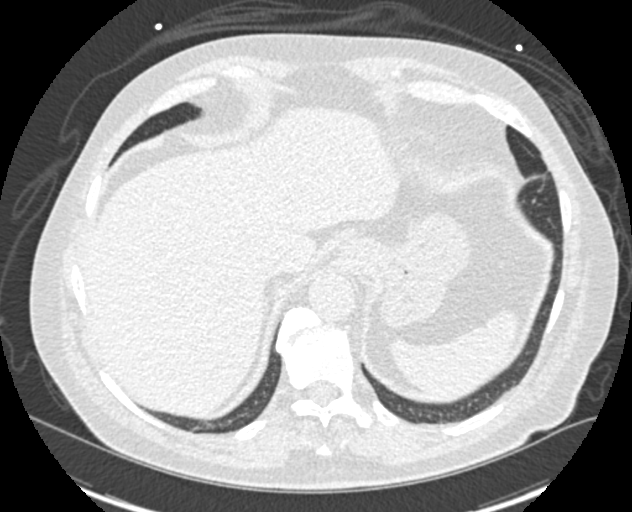
[im 27/80  vessel]
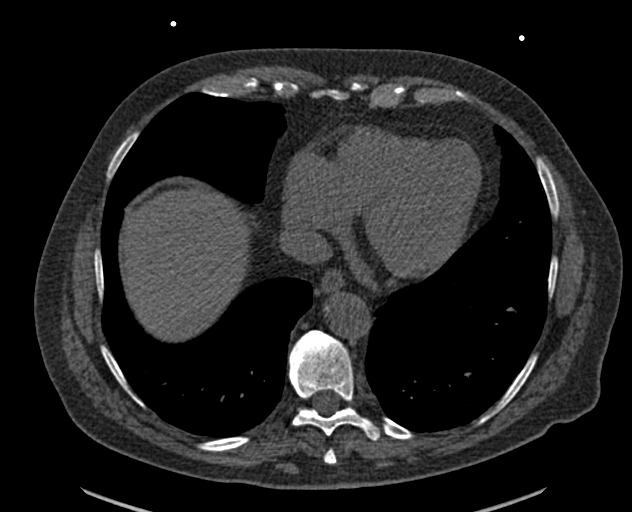
[im 40/80  vessel]
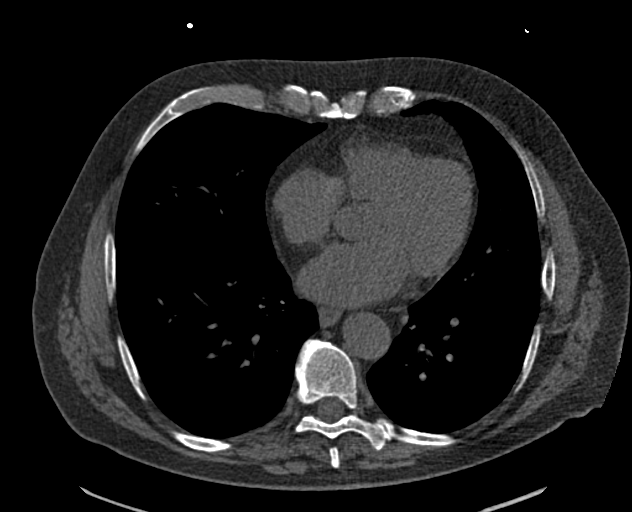
[im 53/80  vessel]
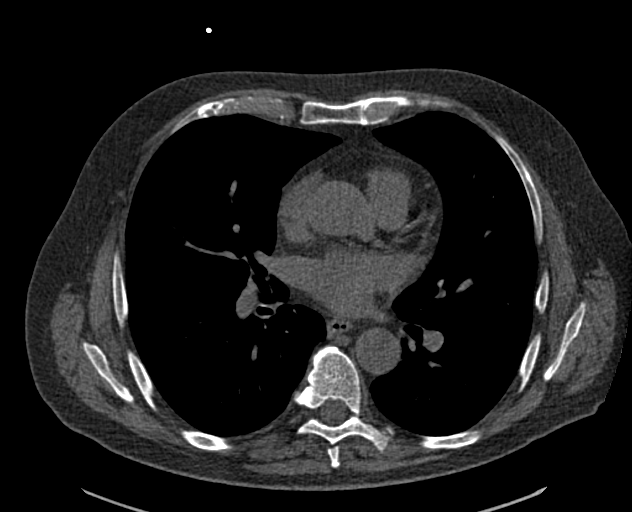
[im 66/80  vessel]
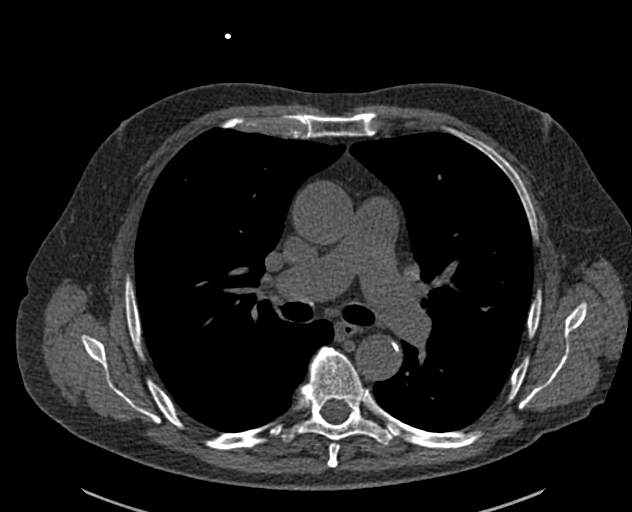
[im 66/80  lung]
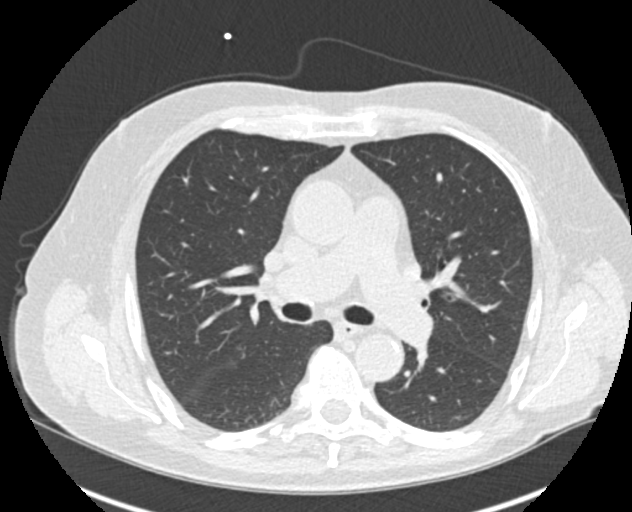

[Series 9: calcium scoring 2.00 br60 bestdiast 71% lungs · axial · 0.61mm/px · z∈[+1571,+1675]mm · 5 of 80 slices shown]
[im 14/80  vessel]
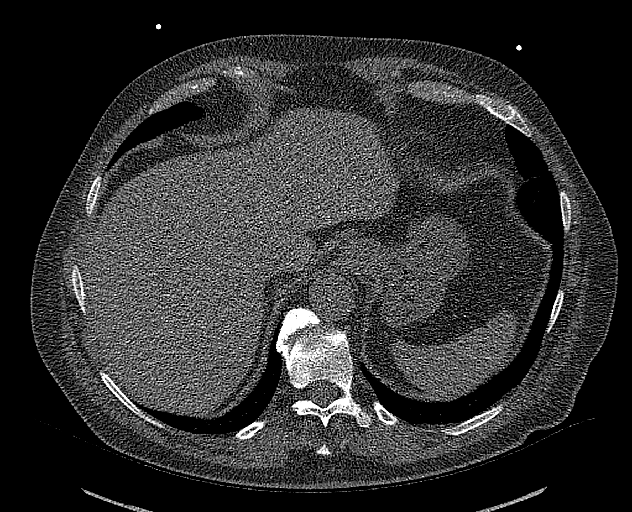
[im 27/80  vessel]
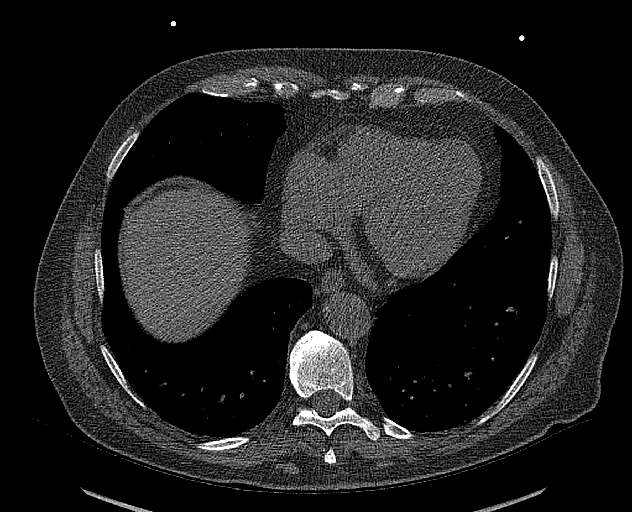
[im 40/80  vessel]
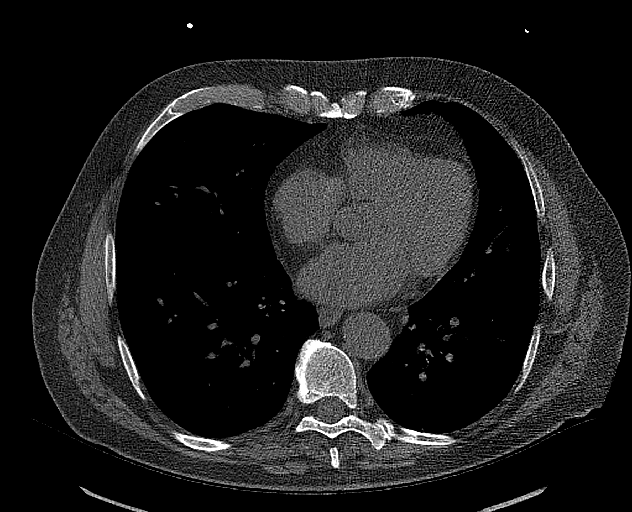
[im 53/80  vessel]
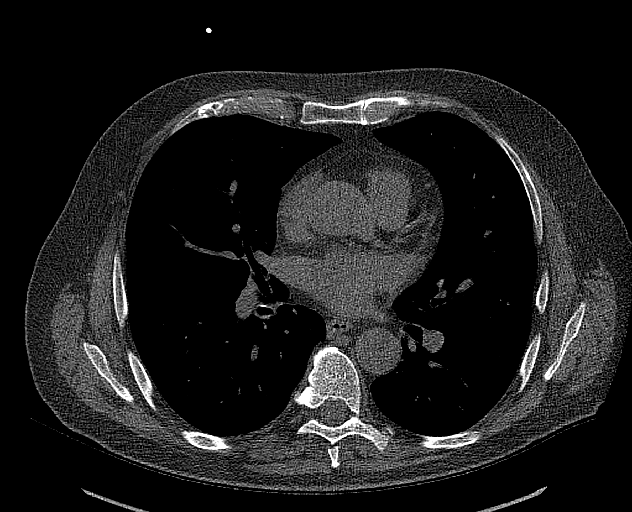
[im 66/80  vessel]
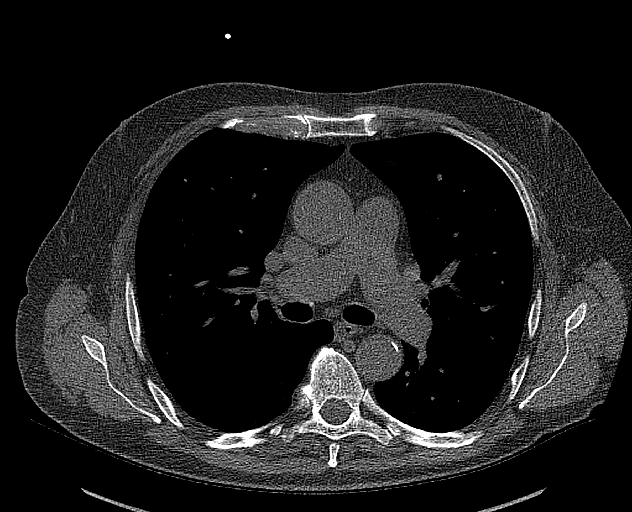

[14 of 20 positions shown; findings below may reference images not displayed]

FINDINGS: Technical quality: Good

CORONARY CALCIUM SCORES:

Left Main: No coronary artery calcification

LAD: 1

LCx: No coronary calcification

RCA: No coronary calcification

CORONARY CALCIUM

Total Agatston Score: 1

[HOSPITAL] percentile: 11

Ascending aorta (normal <  40 mm): 37 mm

EXTRACARDIAC FINDINGS:

Limited view of the lung parenchyma demonstrates no suspicious
nodularity. Airways are normal.

Limited view of the mediastinum demonstrates no adenopathy.
Esophagus normal.

Limited view of the upper abdomen is unremarkable.

Limited view of the skeleton and chest wall is unremarkable.
IMPRESSION: 1. Single LAD coronary artery calcification.

2. Total Agatston Score: 1

3. MESA age and sex matched database percentile: 11th

## 2023-03-14 DIAGNOSIS — H353221 Exudative age-related macular degeneration, left eye, with active choroidal neovascularization: Secondary | ICD-10-CM | POA: Diagnosis not present

## 2023-03-14 DIAGNOSIS — H2513 Age-related nuclear cataract, bilateral: Secondary | ICD-10-CM | POA: Diagnosis not present

## 2023-03-14 DIAGNOSIS — H43813 Vitreous degeneration, bilateral: Secondary | ICD-10-CM | POA: Diagnosis not present

## 2023-03-14 DIAGNOSIS — H353112 Nonexudative age-related macular degeneration, right eye, intermediate dry stage: Secondary | ICD-10-CM | POA: Diagnosis not present

## 2023-03-14 DIAGNOSIS — H43391 Other vitreous opacities, right eye: Secondary | ICD-10-CM | POA: Diagnosis not present

## 2023-03-14 DIAGNOSIS — H43821 Vitreomacular adhesion, right eye: Secondary | ICD-10-CM | POA: Diagnosis not present

## 2023-03-27 DIAGNOSIS — M2021 Hallux rigidus, right foot: Secondary | ICD-10-CM | POA: Diagnosis not present

## 2023-03-27 DIAGNOSIS — M7741 Metatarsalgia, right foot: Secondary | ICD-10-CM | POA: Diagnosis not present

## 2023-03-27 DIAGNOSIS — M79671 Pain in right foot: Secondary | ICD-10-CM | POA: Diagnosis not present

## 2023-04-10 DIAGNOSIS — M2021 Hallux rigidus, right foot: Secondary | ICD-10-CM | POA: Diagnosis not present

## 2023-05-06 DIAGNOSIS — H18413 Arcus senilis, bilateral: Secondary | ICD-10-CM | POA: Diagnosis not present

## 2023-05-06 DIAGNOSIS — H353221 Exudative age-related macular degeneration, left eye, with active choroidal neovascularization: Secondary | ICD-10-CM | POA: Diagnosis not present

## 2023-05-06 DIAGNOSIS — H2513 Age-related nuclear cataract, bilateral: Secondary | ICD-10-CM | POA: Diagnosis not present

## 2023-05-06 DIAGNOSIS — H353111 Nonexudative age-related macular degeneration, right eye, early dry stage: Secondary | ICD-10-CM | POA: Diagnosis not present

## 2023-05-06 DIAGNOSIS — H25043 Posterior subcapsular polar age-related cataract, bilateral: Secondary | ICD-10-CM | POA: Diagnosis not present

## 2023-06-06 DIAGNOSIS — H353112 Nonexudative age-related macular degeneration, right eye, intermediate dry stage: Secondary | ICD-10-CM | POA: Diagnosis not present

## 2023-06-06 DIAGNOSIS — H2513 Age-related nuclear cataract, bilateral: Secondary | ICD-10-CM | POA: Diagnosis not present

## 2023-06-06 DIAGNOSIS — H43821 Vitreomacular adhesion, right eye: Secondary | ICD-10-CM | POA: Diagnosis not present

## 2023-06-06 DIAGNOSIS — H43391 Other vitreous opacities, right eye: Secondary | ICD-10-CM | POA: Diagnosis not present

## 2023-06-06 DIAGNOSIS — H353221 Exudative age-related macular degeneration, left eye, with active choroidal neovascularization: Secondary | ICD-10-CM | POA: Diagnosis not present

## 2023-06-06 DIAGNOSIS — H43813 Vitreous degeneration, bilateral: Secondary | ICD-10-CM | POA: Diagnosis not present

## 2023-07-01 DIAGNOSIS — Z6833 Body mass index (BMI) 33.0-33.9, adult: Secondary | ICD-10-CM | POA: Diagnosis not present

## 2023-07-01 DIAGNOSIS — Z008 Encounter for other general examination: Secondary | ICD-10-CM | POA: Diagnosis not present

## 2023-07-01 DIAGNOSIS — E785 Hyperlipidemia, unspecified: Secondary | ICD-10-CM | POA: Diagnosis not present

## 2023-07-01 DIAGNOSIS — I10 Essential (primary) hypertension: Secondary | ICD-10-CM | POA: Diagnosis not present

## 2023-07-01 DIAGNOSIS — H353290 Exudative age-related macular degeneration, unspecified eye, stage unspecified: Secondary | ICD-10-CM | POA: Diagnosis not present

## 2023-07-01 DIAGNOSIS — M1990 Unspecified osteoarthritis, unspecified site: Secondary | ICD-10-CM | POA: Diagnosis not present

## 2023-07-01 DIAGNOSIS — E669 Obesity, unspecified: Secondary | ICD-10-CM | POA: Diagnosis not present

## 2023-07-09 DIAGNOSIS — H2511 Age-related nuclear cataract, right eye: Secondary | ICD-10-CM | POA: Diagnosis not present

## 2023-07-10 DIAGNOSIS — H2512 Age-related nuclear cataract, left eye: Secondary | ICD-10-CM | POA: Diagnosis not present

## 2023-07-16 DIAGNOSIS — H2511 Age-related nuclear cataract, right eye: Secondary | ICD-10-CM | POA: Diagnosis not present

## 2023-08-06 DIAGNOSIS — H2512 Age-related nuclear cataract, left eye: Secondary | ICD-10-CM | POA: Diagnosis not present

## 2023-08-07 DIAGNOSIS — H2512 Age-related nuclear cataract, left eye: Secondary | ICD-10-CM | POA: Diagnosis not present

## 2023-10-02 DIAGNOSIS — E7800 Pure hypercholesterolemia, unspecified: Secondary | ICD-10-CM | POA: Diagnosis not present

## 2023-10-02 DIAGNOSIS — I10 Essential (primary) hypertension: Secondary | ICD-10-CM | POA: Diagnosis not present

## 2023-10-02 DIAGNOSIS — Z125 Encounter for screening for malignant neoplasm of prostate: Secondary | ICD-10-CM | POA: Diagnosis not present

## 2023-10-09 DIAGNOSIS — Z125 Encounter for screening for malignant neoplasm of prostate: Secondary | ICD-10-CM | POA: Diagnosis not present

## 2023-10-09 DIAGNOSIS — E7800 Pure hypercholesterolemia, unspecified: Secondary | ICD-10-CM | POA: Diagnosis not present

## 2023-10-09 DIAGNOSIS — R251 Tremor, unspecified: Secondary | ICD-10-CM | POA: Diagnosis not present

## 2023-10-09 DIAGNOSIS — Z0000 Encounter for general adult medical examination without abnormal findings: Secondary | ICD-10-CM | POA: Diagnosis not present

## 2023-10-09 DIAGNOSIS — Z6832 Body mass index (BMI) 32.0-32.9, adult: Secondary | ICD-10-CM | POA: Diagnosis not present

## 2023-10-09 DIAGNOSIS — R972 Elevated prostate specific antigen [PSA]: Secondary | ICD-10-CM | POA: Diagnosis not present

## 2023-10-09 DIAGNOSIS — R944 Abnormal results of kidney function studies: Secondary | ICD-10-CM | POA: Diagnosis not present

## 2023-10-09 DIAGNOSIS — I10 Essential (primary) hypertension: Secondary | ICD-10-CM | POA: Diagnosis not present

## 2023-10-10 ENCOUNTER — Encounter: Payer: Self-pay | Admitting: Neurology

## 2023-10-13 DIAGNOSIS — H43813 Vitreous degeneration, bilateral: Secondary | ICD-10-CM | POA: Diagnosis not present

## 2023-10-13 DIAGNOSIS — H353112 Nonexudative age-related macular degeneration, right eye, intermediate dry stage: Secondary | ICD-10-CM | POA: Diagnosis not present

## 2023-10-13 DIAGNOSIS — H43391 Other vitreous opacities, right eye: Secondary | ICD-10-CM | POA: Diagnosis not present

## 2023-10-13 DIAGNOSIS — H353222 Exudative age-related macular degeneration, left eye, with inactive choroidal neovascularization: Secondary | ICD-10-CM | POA: Diagnosis not present

## 2023-10-13 DIAGNOSIS — H43821 Vitreomacular adhesion, right eye: Secondary | ICD-10-CM | POA: Diagnosis not present

## 2023-10-20 NOTE — Progress Notes (Unsigned)
 Assessment/Plan:   Tremor  -Tremor is most consistent with essential tremor.  It is overall mild.  It is not bothersome to patient.  At this point, I do not recommend any medication.   -Alcohol can have complex relationship with tremor, and generally recommend that alcohol be decreased to 2 d/week  -Labs were done by primary care, but do not see a TSH.  We will check that.  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  Will keep an eye on this.  He and I discussed this today.  I would like to see him at least on a yearly basis and he agreed with that.  If something new arises, I would like to see him before that time. Subjective:   Eric Skinner was seen today in the movement disorders clinic for neurologic consultation at the request of Daisy Floro, MD.  The consultation is for the evaluation of tremor and to rule out Parkinson's disease.  Medical records made available to me are reviewed.   Specific Symptoms:  Tremor: Yes.   X few months, bilateral UE but independent of one another.  Mostly with stress/anxious.  He is R hand dominant.  Both shake equally.  Unknown if caffeine changes it.  Not at rest.  Drinks 2 glasses wine/night and he doesn't notice it changes tremor - he notes tremor in the AM.   Family hx of similar:  No. Voice: no changes Sleep: gets to sleep but trouble staying asleep after some hours  Vivid Dreams:  Yes.    Acting out dreams:  Yes.  , has rolled out of bed x 2 (one about a year ago and one a few months ago) Wet Pillows: No. Postural symptoms:  Yes.  , "a little wobbly"  Falls?  Yes.  , one time - was walking dog at night after drinking few glasses of wine and went to pick up stool and fell and hit head.  No loc.  Able to get right back up.  This was about 4-5 weeks ago Bradykinesia symptoms: no bradykinesia noted Loss of smell:  No. Loss of taste:  No. Urinary  Incontinence:  No. Difficulty Swallowing:  No. Handwriting, micrographia: No. Trouble with ADL's:  No.  Trouble buttoning clothing: only with the cuffs Depression:  No. Memory changes:  No. N/V:  No. Lightheaded:  No.  Syncope: No. Diplopia:  No. Dyskinesia:  No. Prior exposure to reglan/antipsychotics: No.  Neuroimaging of the brain has not previously been performed.   PREVIOUS MEDICATIONS: none to date  ALLERGIES:  No Known Allergies  CURRENT MEDICATIONS:  Current Meds  Medication Sig   lisinopril-hydrochlorothiazide (ZESTORETIC) 20-12.5 MG tablet Take by mouth.   Multiple Vitamin (MULTI VITAMIN) TABS 1 tablet Orally Once a day   Multiple Vitamin (MULTI-VITAMIN) tablet Take 1 tablet by mouth daily.   Omega-3 Fatty Acids (FISH OIL) 300 MG CAPS 2 capsules Orally once a day     Objective:   VITALS:   Vitals:   10/22/23 1319  BP: 136/86  Pulse: 86  Weight: 187 lb 6.4 oz (85 kg)  Height: 5\' 7"  (1.702 m)    GEN:  The patient appears stated age and is in NAD. HEENT:  Normocephalic, atraumatic.  The mucous membranes are moist. The superficial temporal arteries are without ropiness or tenderness. CV:  RRR Lungs:  CTAB Neck/HEME:  There are no carotid bruits bilaterally.  Neurological examination:  Orientation: The patient is alert and oriented x3.  Cranial nerves: There is good facial symmetry. There is facial bradykinesia.  Extraocular muscles are intact. The visual fields are full to confrontational testing. The speech is fluent and clear. Soft palate rises symmetrically and there is no tongue deviation. Hearing is intact to conversational tone. Sensation: Sensation is intact to light and pinprick throughout (facial, trunk, extremities). Vibration is slightly decreased distally. There is no extinction with double simultaneous stimulation. There is no sensory dermatomal level identified. Motor: Strength is 5/5 in the bilateral upper and lower extremities.   Shoulder shrug  is equal and symmetric.  There is no pronator drift. Deep tendon reflexes: Deep tendon reflexes are 2/4 at the bilateral biceps, triceps, brachioradialis, patella and achilles. Plantar responses are downgoing bilaterally.  Movement examination: Tone: There is ? Slight increased tone in the RLE (likely paratonia).  Tone elsewhere is normal Abnormal movements: no rest tremor.  There is no postural tremor.  There is mild intention tremor bilaterally.  Mild tremor with Archimedes spirals.     Coordination:  There is no decremation with RAM's, with any form of RAMS, including alternating supination and pronation of the forearm, hand opening and closing, finger taps, heel taps and toe taps.  Gait and Station: The patient has no difficulty arising out of a deep-seated chair without the use of the hands. The patient's stride length is good.   I have reviewed and interpreted the following labs independently Patient had lab work through primary care on October 02, 2023.  Sodium was 139, potassium 5.5, chloride 106, CO2 25, BUN 43, creatinine 1.64, glucose 98  Total time spent on today's visit was 45 minutes, including both face-to-face time and nonface-to-face time.  Time included that spent on review of records (prior notes available to me/labs/imaging if pertinent), discussing treatment and goals, answering patient's questions and coordinating care.  Cc:  Pcp, No

## 2023-10-22 ENCOUNTER — Encounter: Payer: Self-pay | Admitting: Neurology

## 2023-10-22 ENCOUNTER — Other Ambulatory Visit

## 2023-10-22 ENCOUNTER — Ambulatory Visit: Admitting: Neurology

## 2023-10-22 VITALS — BP 136/86 | HR 86 | Ht 67.0 in | Wt 187.4 lb

## 2023-10-22 DIAGNOSIS — R251 Tremor, unspecified: Secondary | ICD-10-CM

## 2023-10-22 DIAGNOSIS — R258 Other abnormal involuntary movements: Secondary | ICD-10-CM

## 2023-10-23 ENCOUNTER — Encounter: Payer: Self-pay | Admitting: Neurology

## 2023-10-23 LAB — TSH: TSH: 1.23 m[IU]/L (ref 0.40–4.50)

## 2024-01-07 DIAGNOSIS — R944 Abnormal results of kidney function studies: Secondary | ICD-10-CM | POA: Diagnosis not present

## 2024-01-07 DIAGNOSIS — R972 Elevated prostate specific antigen [PSA]: Secondary | ICD-10-CM | POA: Diagnosis not present

## 2024-01-14 DIAGNOSIS — H43813 Vitreous degeneration, bilateral: Secondary | ICD-10-CM | POA: Diagnosis not present

## 2024-01-14 DIAGNOSIS — H353222 Exudative age-related macular degeneration, left eye, with inactive choroidal neovascularization: Secondary | ICD-10-CM | POA: Diagnosis not present

## 2024-01-14 DIAGNOSIS — H353112 Nonexudative age-related macular degeneration, right eye, intermediate dry stage: Secondary | ICD-10-CM | POA: Diagnosis not present

## 2024-01-14 DIAGNOSIS — H43821 Vitreomacular adhesion, right eye: Secondary | ICD-10-CM | POA: Diagnosis not present

## 2024-01-14 DIAGNOSIS — H43391 Other vitreous opacities, right eye: Secondary | ICD-10-CM | POA: Diagnosis not present

## 2024-01-15 DIAGNOSIS — M2021 Hallux rigidus, right foot: Secondary | ICD-10-CM | POA: Diagnosis not present

## 2024-01-15 DIAGNOSIS — M7741 Metatarsalgia, right foot: Secondary | ICD-10-CM | POA: Diagnosis not present

## 2024-01-15 DIAGNOSIS — S93491A Sprain of other ligament of right ankle, initial encounter: Secondary | ICD-10-CM | POA: Diagnosis not present

## 2024-01-22 DIAGNOSIS — S93491D Sprain of other ligament of right ankle, subsequent encounter: Secondary | ICD-10-CM | POA: Diagnosis not present

## 2024-03-12 DIAGNOSIS — R972 Elevated prostate specific antigen [PSA]: Secondary | ICD-10-CM | POA: Diagnosis not present

## 2024-06-02 DIAGNOSIS — R972 Elevated prostate specific antigen [PSA]: Secondary | ICD-10-CM | POA: Diagnosis not present

## 2024-06-09 DIAGNOSIS — R972 Elevated prostate specific antigen [PSA]: Secondary | ICD-10-CM | POA: Diagnosis not present

## 2024-06-09 DIAGNOSIS — N400 Enlarged prostate without lower urinary tract symptoms: Secondary | ICD-10-CM | POA: Diagnosis not present

## 2024-06-09 DIAGNOSIS — R399 Unspecified symptoms and signs involving the genitourinary system: Secondary | ICD-10-CM | POA: Diagnosis not present

## 2024-07-09 DIAGNOSIS — H43813 Vitreous degeneration, bilateral: Secondary | ICD-10-CM | POA: Diagnosis not present

## 2024-07-09 DIAGNOSIS — H43821 Vitreomacular adhesion, right eye: Secondary | ICD-10-CM | POA: Diagnosis not present

## 2024-07-09 DIAGNOSIS — H43391 Other vitreous opacities, right eye: Secondary | ICD-10-CM | POA: Diagnosis not present

## 2024-07-09 DIAGNOSIS — H353112 Nonexudative age-related macular degeneration, right eye, intermediate dry stage: Secondary | ICD-10-CM | POA: Diagnosis not present

## 2024-07-09 DIAGNOSIS — H353221 Exudative age-related macular degeneration, left eye, with active choroidal neovascularization: Secondary | ICD-10-CM | POA: Diagnosis not present

## 2024-10-21 ENCOUNTER — Ambulatory Visit: Admitting: Neurology
